# Patient Record
Sex: Male | Born: 1960 | Race: White | Hispanic: No
Health system: Northeastern US, Academic
[De-identification: ages and names within clinical notes are randomized; demographics above are authoritative.]

---

## 2018-05-18 LAB — UNMAPPED LAB RESULTS
ALT/SGPT (EXT): 34 U/L (ref 10–40)
AST/SGOT (EXT): 29 U/L (ref 10–40)

## 2018-07-09 ENCOUNTER — Ambulatory Visit

## 2018-07-12 ENCOUNTER — Ambulatory Visit: Admit: 2018-07-12

## 2018-07-12 ENCOUNTER — Ambulatory Visit: Admitting: Neurology

## 2018-07-12 NOTE — Progress Notes (Signed)
.  Progress Notes  .  Patient: Robert May, Robert May  Provider: Ronna Polio    .  DOB: Jan 24, 1961 Age: 57 Y Sex: Male  .  PCP: Unknown, Unknown    Date: 07/12/2018  .  --------------------------------------------------------------------------------  .  REASON FOR APPOINTMENT  .  1. FU 30  .  2. Headaches  .  HISTORY OF PRESENT ILLNESS  .  GENERAL:   Dear Dr. Kyung Bacca is follow-up visit for 57 year old man who  had work accident in 2004. He was cutting pipes, his instrument  became dysfunctional, cut his face on the right side, hit him on  the head. He developed facial fracture. The patient had 7 plastic  surgeries. He developed bad headaches after that, numbness on the  face. Through the years headache became more tolerable and less  frequent. He still has a time to time. He does not take any  medication for headaches now. He was followed by several  neurologist. His wife is saying that patient is sad but he denies  any depression or anxiety. He sleeps well. According to his wife  he is snoring, he is tired during the day and sometimes he falls  asleep for short time. The patient feels drowsy when he is  watching TV, as a passenger in the car, when he is lying down in  the afternoon, when he is sitting quietly after a lunch. He never  feels drowsy when he is driving himself or when he is stopped in  the traffic as a driver.  .  CURRENT MEDICATIONS  .  Taking tylenol  .  PAST MEDICAL HISTORY  .  Posttraumatic syndrom  Palpitation  Headaches  .  ALLERGIES  .  N.K.D.A.  .  SURGICAL HISTORY  .  head injury  knee surgery  .  FAMILY HISTORY  .  Mother: deceased 47 yrs  Father: deceased 78 yrs  2 son(s) - healthy.  CHF.  Marland Kitchen  SOCIAL HISTORY  .  Doesn't smoke. Takes one glass of wine with dinner. Married. Has  2 children. Retired.  Marland Kitchen  REVIEW OF SYSTEMS  .  Has stomachache sometimes. No nausea. Sometimes has heartburn. No  cough, sometimes has SOB. Sometimes has chest pain. No  palpitations. Has headaches sometimes. Has bad  dreams and  sometimes he seems to act in sleep.Has positional dizziness. No  prblems to urinate. No constipation. The rest of 10 point system  review is negative.  Marland Kitchen  VITAL SIGNS  .  Pain scale 1, Ht-in 69, Wt-lbs 200, BMI 29.53, BP 132/62, RR 12,  Pulse sitting 66.  Marland Kitchen  PHYSICAL EXAMINATION  .  GENERAL:  General Appearance:  well-developed, well-hydrated,  well-nourished, well-groomed, No dysmorphic features, no acute  distress, alert and oriented.  Mood/Affect:  sad.  HEENT  Normocephalic, atraumatic. Sclera clear, conjunctiva pink,  moist oral mucosa, neck supple.  Neck  No carotid Bruits, supple.  Skin  No rash.  Cardiovascular  heart regular rate and rhythm.  The patient is awake and alert with slightly decreased memory,  good attention and concentration, general fund of knowledge and  language. He doesn't smile, but as I said he denies depression.  No papilledema. No bruits of the head and neck. Pupils equal and  reactive to light. Extraocular movements intact. Normal sensation  the face. No facial muscle weakness. Soft palate up. Tongue in  the midline. He can shrug her shoulders. His hearing is down. He  has scars on the face. Visual  fields are full on confrontation.  He moves all 4 extremities. Deep tendon reflexes symmetrical.  Plantar response downgoing. Finger-to-nose fine. Normal gait. No  meningeal signs. No astereognosis. Neck circumference is 16.  Throat exam is fine. ESS is 10.  .  ASSESSMENTS  .  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  Obstructive sleep apnea - G47.33  .  Mood disorder - F39  .  The patient had significant accident at walk with facial fracture  and multiple plastic surgeries. He developed posttraumatic  headache improved somewhat through the years. He may have  posttraumatic mood disorder, unspecified, not real depression.  The patient doesn't want to take medications for his headache or  mood. Risk of not taking medication discussed. He does not  have  any suicidal thoughts. The patient may have slight cognitive  impairment, also secondary to injury. I will followed clinically.  I suspect the patient may have obstructive sleep apnea. He should  avoid sleeping in the supine position. He should watch his  weight. He needs to do exercises and to lead healthy lifestyle.  The patient was explained that untreated sleep disordered  breathing can predispose to accidents and cardiovascular  condition. He wants to think about sleep test, he will give me a  call about his decision. His wife was present on discussion.I  spent on counseling 45 minutes more than 50% of the time of the  appointment.Follow up appointment is scheduled.Thank you very  much for let me taking care about him.  .  FOLLOW UP  .  1 Year  .  Electronically signed by Ronna Polio , MD on  07/12/2018 at 07:29 PM EST  .  Document electronically signed by Ronna Polio    .

## 2018-07-12 NOTE — Progress Notes (Signed)
---    **Progress Notes**    ---    **Patient:** Robert May   **Provider:** Ronna Polio, MD     **DOB:** [birth year] **Age:** 23 Y **Sex:** Male   **Date:** 07/12/2018     **PCP:** Unknown Unknown          * * *        ---        **Reason for Appointment**    ---       1\. FU 30    ---    2\. Headaches    ---       **History of Present Illness**    ---     _GENERAL_ :    Dear Dr. Boykin Reaper,    this is follow-up visit for 57 year old man who had work accident in 2004. He  was cutting pipes, his instrument became dysfunctional, cut his face on the  right side, hit him on the head. He developed facial fracture. The patient had  7 plastic surgeries. He developed bad headaches after that, numbness on the  face. Through the years headache became more tolerable and less frequent. He  still has a time to time. He does not take any medication for headaches now.  He was followed by several neurologist. His wife is saying that patient is sad  but he denies any depression or anxiety. He sleeps well. According to his wife  he is snoring, he is tired during the day and sometimes he falls asleep for  short time. The patient feels drowsy when he is watching TV, as a passenger in  the car, when he is lying down in the afternoon, when he is sitting quietly  after a lunch. He never feels drowsy when he is driving himself or when he is  stopped in the traffic as a driver.       **Current Medications**    ---    Taking     * tylenol     ---      **Past Medical History**    ---       Posttraumatic syndrom.        ---    Palpitation.        ---    Headaches.        ---         --- ---       **Surgical History**    ---       head injury    ---    knee surgery    ---      **Family History**    ---       Mother: deceased 26 yrs    ---    Father: deceased 38 yrs    ---    2 son(s) - healthy.    ---    CHF.    ---       **Social History**    ---       Doesn't smoke. Takes one glass of wine with dinner. Married. Has 2  children. Retired.     ---       **Allergies**    ---       N.K.D.A.    ---       **Review of Systems**    ---    Has stomachache sometimes. No nausea. Sometimes has heartburn. No cough,  sometimes has SOB. Sometimes has chest pain. No palpitations. Has headaches  sometimes. Has bad dreams and sometimes he seems  to act in sleep.Has  positional dizziness. No prblems to urinate. No constipation. The rest of 10  point system review is negative.       **Vital Signs**    ---    Pain scale 1, Ht-in 69, Wt-lbs 200, BMI 29.53, BP 132/62, RR 12, Pulse sitting  66.       **Physical Examination**    ---     _GENERAL_ :    General Appearance: well-developed, well-hydrated, well-nourished, well-  groomed, No dysmorphic features, no acute distress, alert and oriented.    Mood/Affect: sad.    HEENT Normocephalic, atraumatic. Sclera clear, conjunctiva pink, moist oral  mucosa, neck supple.    Neck No carotid Bruits, supple.    Skin No rash.    Cardiovascular heart regular rate and rhythm.    The patient is awake and alert with slightly decreased memory, good attention  and concentration, general fund of knowledge and language. He doesn't smile,  but as I said he denies depression. No papilledema. No bruits of the head and  neck. Pupils equal and reactive to light. Extraocular movements intact. Normal  sensation the face. No facial muscle weakness. Soft palate up. Tongue in the  midline. He can shrug her shoulders. His hearing is down. He has scars on the  face. Visual fields are full on confrontation. He moves all 4 extremities.  Deep tendon reflexes symmetrical. Plantar response downgoing. Finger-to-nose  fine. Normal gait. No meningeal signs. No astereognosis. Neck circumference is  16. Throat exam is fine. ESS is 10.       **Assessments**    ---    1\. 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 Obstructive sleep apnea - G47.33    ---    3\. Mood disorder - F39    ---      The patient had significant accident  at walk with facial fracture and  multiple plastic surgeries. He developed posttraumatic headache improved  somewhat through the years. He may have posttraumatic mood disorder,  unspecified, not real depression. The patient doesn't want to take medications  for his headache or mood. Risk of not taking medication discussed. He does not  have any suicidal thoughts. The patient may have slight cognitive impairment,  also secondary to injury. I will followed clinically. I suspect the patient  may have obstructive sleep apnea. He should avoid sleeping in the supine  position. He should watch his weight. He needs to do exercises and to lead  healthy lifestyle. The patient was explained that untreated sleep disordered  breathing can predispose to accidents and cardiovascular condition. He wants  to think about sleep test, he will give me a call about his decision. His wife  was present on discussion.    I spent on counseling 45 minutes more than 50% of the time of the appointment.    Follow up appointment is scheduled.    Thank you very much for let me taking care about him.    ---       **Follow Up**    ---    1 Year    Electronically signed by Ronna Polio , MD on 07/12/2018 at 07:29 PM EST    Sign off status: Completed          * * *      Patient: Robert May   Provider: Ronna Polio, MD    --- ---    DOB: 13-May-1961  Date:  07/12/2018    Note generated by eClinicalWorks EMR/PM Software ([email or web address])    ---

## 2019-01-20 ENCOUNTER — Ambulatory Visit: Admitting: Neurology

## 2019-01-20 NOTE — Progress Notes (Signed)
* * *      [name], [name] **DOB:** October 08, 1960 (58 yo M) **Acc No.** [id number] **DOS:**  01/20/2019    ---       [name], [name]**    ------    51 Y old Male, DOB: 1961-07-15    [address], [place], [place] [address]    Home: [phone]    Provider: Ronna Polio        * * *    Telephone Encounter    ---    Answered by  Dierdre Forth Date: 01/20/2019       Time: 10:49 AM    Reason  APPT FOR 7/6    ------            Message                     Patient has WC but dates are back dated. Can you take a look please?                Action Taken                     Namubiru,Percis  01/20/2019 10:51:59 AM >      Hi Percis, this is all set. I spoke to the adjuster earlier in the week and he called registration with all the work comp information.  He has [place] ins and he is all set thank you, karen      Torti,Karen  01/20/2019 11:25:47 AM >                    * * *                ---          * * *         Provider: Ronna Polio 01/20/2019    ---    Note generated by eClinicalWorks EMR/PM Software ([email or web address])

## 2019-01-24 ENCOUNTER — Ambulatory Visit: Admit: 2019-01-24 | Payer: Worker's Comp, Other unspecified

## 2019-01-24 ENCOUNTER — Ambulatory Visit: Admitting: Neurology

## 2019-01-24 NOTE — Progress Notes (Signed)
 .  Progress Notes  .  Patient: Robert May, Robert May  Provider: Ronna Polio    .  DOB: March 27, 1961 Age: 58 Y Sex: Male  .  PCP: Unknown, Unknown    Date: 01/24/2019  .  --------------------------------------------------------------------------------  .  REASON FOR APPOINTMENT  .  1. IN OFFICE FU WC  .  HISTORY OF PRESENT ILLNESS  .  GENERAL:   Dear Dr. Kyung Bacca is follow-up visit for 58 year old man. As  you know, he had work accident in 2004. He was cutting pipes, his  instrument became dysfunctional, cut his face on the right side,  hit him on the head. He developed facial fracture. The patient  had 7 plastic surgeries. He developed bad headaches after that,  numbness on the face. Through the years headache became more  tolerable and less frequent. He still has a time to time. He does  not take any medication for headaches now. He was followed by  several neurologist. During the last several months the patient  was stable, he did not have new symptoms, his headaches did not  get any worse. On the last appointment I suspected possibility of  sleep apnea, I discussed at length with the patient and explained  him by I think so, but the patient declined sleep study and he  still does not want to do it, we talked about it today again. I  explained to him that untreated sleep disordered breathing can  predispose to accidents and cardiovascular condition.  .  CURRENT MEDICATIONS  .  Taking tylenol  .  PAST MEDICAL HISTORY  .  Posttraumatic syndrom  Palpitation  Headaches  .  ALLERGIES  .  N.K.D.A.  .  SURGICAL HISTORY  .  head injury  knee surgery  .  FAMILY HISTORY  .  Mother: deceased 46 yrs  Father: deceased 58 yrs  2 son(s) - healthy.  CHF.  Marland Kitchen  SOCIAL HISTORY  .  Doesn't smoke. Takes one glass of wine with dinner. Married. Has  2 children. Retired.  Marland Kitchen  REVIEW OF SYSTEMS  .  The patient does not have stomachache. No nausea. No heartburn.  No cough, or SOB. No chest pain. No palpitations. Has headaches  sometimes. Has bad  dreams and sometimes he seems to act in  sleep.Has positional dizziness. No problems to urinate. No  constipation. The rest of 10 point system review is negative.  Marland Kitchen  VITAL SIGNS  .  Pain scale 1, Ht-in 69, Wt-lbs 195, BMI 28.79.  Marland Kitchen  PHYSICAL EXAMINATION  .  GENERAL:  General Appearance:  well-developed, well-hydrated,  well-nourished, well-groomed, No dysmorphic features, no acute  distress, alert and oriented.  Mood/Affect:  sad.  HEENT  Normocephalic, atraumatic. Sclera clear, conjunctiva pink,  moist oral mucosa, neck supple.  Neck  No carotid Bruits, supple.  Skin  No rash.  Cardiovascular  heart regular rate and rhythm.  The patient is awake and alert with slightly decreased memory,  good attention and concentration, general fund of knowledge and  language. He doesn't smile. Denies depression. No papilledema. No  bruits of the head and neck. Pupils equal and reactive to light.  Extraocular movements intact. Normal sensation the face. No  facial muscle weakness. Soft palate up. Tongue in the midline. He  can shrug shoulders. His hearing is down. He has scars on the  face. Visual fields are full on confrontation. He moves all 4  extremities. Deep tendon reflexes symmetrical. Plantar response  downgoing. Finger-to-nose fine. Normal  gait. No meningeal signs.  No astereognosis. ESS is 10.  .  ASSESSMENTS  .  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  Obstructive sleep apnea - G47.33  .  Mood disorder - F39  .  The patient had significant accident at walk with facial fracture  and multiple plastic surgeries. He developed posttraumatic  headache improved somewhat through the years. He may have  posttraumatic mood disorder, unspecified, not real depression.  The patient doesn't want to take medications for his headache or  mood. Risk of not taking medication discussed. He does not have  any suicidal thoughts. The patient may have slight cognitive  impairment, also secondary to injury. I  will followed clinically.  I suspect the patient may have obstructive sleep apnea but he is  refusing evaluation.The patient can call me with any questions or  concerns.Follow up appointment is scheduled.Thank you very much  for let me taking care about him.  .  FOLLOW UP  .  1 Year  .  Electronically signed by Ronna Polio , MD on  01/25/2019 at 12:55 AM EDT  .  Document electronically signed by Ronna Polio    .

## 2019-01-24 NOTE — Progress Notes (Signed)
 [name], [name] **DOB:** 1961/06/12 (58 yo M) **Acc No.** [id number] **DOS:**  01/24/2019    ---        ---     **Progress Notes**    ---    **Patient:** Robert May  **Provider:** Ronna Polio, MD     **DOB:** 14-Nov-1960 **Age:** 58 Y **Sex:** Male  **Date:** 01/24/2019     **PCP:** Unknown Unknown         * * *        ---      **Reason for Appointment**    ---      1\. IN OFFICE FU WC    ---      **History of Present Illness**    ---     _GENERAL_ :    Dear Dr. Boykin Reaper,    this is follow-up visit for 58 year old man. As you know, he had work accident  in 2004. He was cutting pipes, his instrument became dysfunctional, cut his  face on the right side, hit him on the head. He developed facial fracture. The  patient had 7 plastic surgeries. He developed bad headaches after that,  numbness on the face. Through the years headache became more tolerable and  less frequent. He still has a time to time. He does not take any medication  for headaches now. He was followed by several neurologist. During the last  several months the patient was stable, he did not have new symptoms, his  headaches did not get any worse. On the last appointment I suspected  possibility of sleep apnea, I discussed at length with the patient and  explained him by I think so, but the patient declined sleep study and he still  does not want to do it, we talked about it today again. I explained to him  that untreated sleep disordered breathing can predispose to accidents and  cardiovascular condition.      **Current Medications**    ---    Taking    * tylenol     ---     **Past Medical History**    ---      Posttraumatic syndrom.        ---    Palpitation.        ---    Headaches.        ---        ------      **Surgical History**    ---      head injury    ---    knee surgery    ---     **Family History**    ---      Mother: deceased 63 yrs    ---    Father: deceased 56 yrs    ---    2 son(s) - healthy.    ---    CHF.    ---       **Social History**    ---      Doesn't smoke. Takes one glass of wine with dinner. Married. Has 2  children. Retired.    ---      **Allergies**    ---      N.K.D.A.    ---      **Review of Systems**    ---    The patient does not have stomachache. No nausea. No heartburn. No cough, or  SOB. No chest pain. No palpitations. Has headaches sometimes. Has bad dreams  and sometimes he seems to act in sleep.Has positional dizziness. No  problems  to urinate. No constipation. The rest of 10 point system review is negative.      **Vital Signs**    ---    Pain scale 1, Ht-in 69, Wt-lbs 195, BMI 28.79.      **Physical Examination**    ---     _GENERAL_ :    General Appearance: well-developed, well-hydrated, well-nourished, well-  groomed, No dysmorphic features, no acute distress, alert and oriented.    Mood/Affect: sad.    HEENT Normocephalic, atraumatic. Sclera clear, conjunctiva pink, moist oral  mucosa, neck supple.    Neck No carotid Bruits, supple.    Skin No rash.    Cardiovascular heart regular rate and rhythm.    The patient is awake and alert with slightly decreased memory, good attention  and concentration, general fund of knowledge and language. He doesn't smile.  Denies depression. No papilledema. No bruits of the head and neck. Pupils  equal and reactive to light. Extraocular movements intact. Normal sensation  the face. No facial muscle weakness. Soft palate up. Tongue in the midline. He  can shrug shoulders. His hearing is down. He has scars on the face. Visual  fields are full on confrontation. He moves all 4 extremities. Deep tendon  reflexes symmetrical. Plantar response downgoing. Finger-to-nose fine. Normal  gait. No meningeal signs. No astereognosis. ESS is 10.      **Assessments**    ---    1\. 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 Obstructive sleep apnea - G47.33    ---    3\. Mood disorder - F39    ---     The patient had significant accident at walk  with facial fracture and  multiple plastic surgeries. He developed posttraumatic headache improved  somewhat through the years. He may have posttraumatic mood disorder,  unspecified, not real depression. The patient doesn't want to take medications  for his headache or mood. Risk of not taking medication discussed. He does not  have any suicidal thoughts. The patient may have slight cognitive impairment,  also secondary to injury. I will followed clinically. I suspect the patient  may have obstructive sleep apnea but he is refusing evaluation.The patient can  call me with any questions or concerns.    Follow up appointment is scheduled.    Thank you very much for let me taking care about him.    ---      **Follow Up**    ---    1 Year    Electronically signed by Ronna Polio , MD on 01/25/2019 at 12:55 AM EDT    Sign off status: Completed          * * *      Provider: Ronna Polio, MD Date: 01/24/2019    ------    Note generated by eClinicalWorks EMR/PM Software ([email or web address])    ---

## 2019-06-25 LAB — LIPID PROFILE (EXT)
Chol/HDL Ratio (EXT): 4.3
Cholesterol (EXT): 164 mg/dL (ref <200)
HDL Cholesterol (EXT): 38 mg/dL — ABNORMAL LOW (ref 40–59)
LDL Cholesterol (EXT): 109 mg/dL — ABNORMAL HIGH (ref <100)
NON HDL Cholesterol (EXT): 126 mg/dL
Triglycerides (EXT): 86 mg/dL (ref <150)
VLDL Cholesterol (EXT): 17.2 mg/dL

## 2019-06-25 LAB — UNMAPPED LAB RESULTS: Glucose (EXT): 99 mg/dL (ref 70–99)

## 2019-06-26 LAB — PSA (EXT): PSA (EXT): 0.5 ng/mL (ref <=4.0)

## 2019-10-18 ENCOUNTER — Ambulatory Visit

## 2019-10-19 ENCOUNTER — Ambulatory Visit: Admit: 2019-10-19 | Payer: Worker's Comp, Other unspecified

## 2019-10-19 ENCOUNTER — Ambulatory Visit: Admitting: Neurology

## 2019-10-19 NOTE — Progress Notes (Signed)
 .  Progress Notes  .  Patient: Robert May, Robert May  Provider: Ronna Polio    .  DOB: [birth year] Age: 59 Y Sex: Male  .  PCP: Unknown, Unknown    Date: 10/19/2019  .  --------------------------------------------------------------------------------  .  REASON FOR APPOINTMENT  .  1. Headaches  .  HISTORY OF PRESENT ILLNESS  .  GENERAL:   Dear Dr. Kyung Bacca is follow-up visit for 59 year old man. As  you know, he had work accident in 2004. He was cutting pipes, his  instrument became dysfunctional, cut his face on the right side,  hit him on the head. He developed facial fracture. The patient  had 7 plastic surgeries. He developed bad headaches after that,  numbness on the face. Through the years headache became more  tolerable and less frequent. He still has a time to time. He does  not take any medication for headaches now. He was followed by  several neurologist. The patient is stable, no new symptoms, his  headaches are not frequent and not severe. He is tired, snoring,  may have obstructive sleep apnea, but refuses sleep study. The  patient is saying that his mood is fine and his memory is stable.  He did not have any new medical problems, medications, allergy.  .  CURRENT MEDICATIONS  .  Taking tylenol  .  PAST MEDICAL HISTORY  .  Posttraumatic syndrom  Palpitation  Headaches  .  ALLERGIES  .  N.K.D.A.  .  SURGICAL HISTORY  .  head injury  knee surgery  .  FAMILY HISTORY  .  Mother: deceased 60 yrs  Father: deceased 45 yrs  2 son(s) - healthy.  CHF.  Marland Kitchen  SOCIAL HISTORY  .  Doesn't smoke. Takes one glass of wine with dinner. Married. Has  2 children. Retired.  Marland Kitchen  REVIEW OF SYSTEMS  .  As above. The patient does not have stomachache. No nausea. No  heartburn. No cough, or SOB. No chest pain. No palpitations. Has  headaches sometimes. Has bad dreams and sometimes he seems to act  in sleep.Has no dizziness. No problems to urinate. No  constipation. The rest of 10 point system review is negative.  Marland Kitchen  VITAL SIGNS  .  Pain  scale 1, Ht-in 69, Wt-lbs 190, BMI 28.06.  .  PHYSICAL EXAMINATION  .  GENERAL:  General Appearance:  well-developed, well-hydrated,  well-nourished, well-groomed, No dysmorphic features, no acute  distress, alert and oriented.  Mood/Affect:  apathic.  HEENT  Normocephalic, atraumatic. Sclera clear, conjunctiva pink,  moist oral mucosa, neck supple.  Neck  supple.  Skin  No rash.  Cardiovascular  no carotid bruits.  The patient is awake and alert with decreased memory, good  attention and concentration, general fund of knowledge and  language. He gives approximate answers on most of my questions.  He doesn't smile. Denies depression. No papilledema. No bruits of  the head and neck. Pupils equal and reactive to light.  Extraocular movements intact. Normal sensation the face. No  facial muscle weakness. Soft palate up. Tongue in the midline. He  can shrug shoulders. His hearing is down. He has scars on the  face. Visual fields are full on confrontation. He moves all 4  extremities. Deep tendon reflexes symmetrical. Plantar response  downgoing. Finger-to-nose fine. Normal gait. No meningeal signs.  No astereognosis. ESS is 11.  .  ASSESSMENTS  .  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  Obstructive sleep  apnea - G47.33  .  Mood disorder - F39  .  The patient is 59 year old man who had significant accident at  walk with facial fracture and multiple plastic surgeries. He  developed posttraumatic headache, improved somewhat through the  years. He may have posttraumatic mood disorder, unspecified, not  real depression, may be disthymia and apathy. The patient doesn't  want to take medications for his headache or mood. Risk of not  taking medication discussed. He does not have any suicidal  thoughts. The patient may have slight cognitive impairment, also  secondary to injury. In the same time he gives me approximate  answers to most of the questions. I suspect the patient may  have  obstructive sleep apnea but he is refusing evaluation. We talk  about it again today. I would like to check his  neuropsychological test and the patient agreed, but later I  received a message that he wants to postpone it. The patient can  call me with any questions or concerns.Follow up appointment is  scheduled.Thank you for let me taking care about him.Visit lasted  30 minutes.  .  FOLLOW UP  .  6 Months  .  Electronically signed by Ronna Polio , MD on  10/19/2019 at 04:55 PM EDT  .  Document electronically signed by Ronna Polio    .

## 2019-10-19 NOTE — Progress Notes (Signed)
 Robert May, Robert May **DOB:** [birth year] (59 yo M) **Acc No.** [id number] **DOS:**  10/19/2019    ---        ---     **Progress Notes**    ---    **Patient:** Robert May  **Provider:** Ronna Polio, MD     **DOB:** [birth year] **Age:** 59 Y **Sex:** Male  **Date:** 10/19/2019     **PCP:** Unknown Unknown         * * *        ---      **Reason for Appointment**    ---      1\. Headaches    ---      **History of Present Illness**    ---     _GENERAL_ :    Dear Dr. Boykin Reaper,    this is follow-up visit for 59 year old man. As you know, he had work accident  in 2004. He was cutting pipes, his instrument became dysfunctional, cut his  face on the right side, hit him on the head. He developed facial fracture. The  patient had 7 plastic surgeries. He developed bad headaches after that,  numbness on the face. Through the years headache became more tolerable and  less frequent. He still has a time to time. He does not take any medication  for headaches now. He was followed by several neurologist. The patient is  stable, no new symptoms, his headaches are not frequent and not severe. He is  tired, snoring, may have obstructive sleep apnea, but refuses sleep study. The  patient is saying that his mood is fine and his memory is stable. He did not  have any new medical problems, medications, allergy.      **Current Medications**    ---    Taking    * tylenol     ---     **Past Medical History**    ---      Posttraumatic syndrom.        ---    Palpitation.        ---    Headaches.        ---        ------      **Surgical History**    ---      head injury    ---    knee surgery    ---     **Family History**    ---      Mother: deceased 30 yrs    ---    Father: deceased 19 yrs    ---    2 son(s) - healthy.    ---    CHF.    ---      **Social History**    ---      Doesn't smoke. Takes one glass of wine with dinner. Married. Has 2  children. Retired.    ---      **Allergies**    ---      N.K.D.A.    ---      **Review of  Systems**    ---    As above. The patient does not have stomachache. No nausea. No heartburn. No  cough, or SOB. No chest pain. No palpitations. Has headaches sometimes. Has  bad dreams and sometimes he seems to act in sleep.Has no dizziness. No  problems to urinate. No constipation. The rest of 10 point system review is  negative.      **Vital Signs**    ---    Pain scale 1, Ht-in 69,  Wt-lbs 190, BMI 28.06.      **Physical Examination**    ---     _GENERAL_ :    General Appearance: well-developed, well-hydrated, well-nourished, well-  groomed, No dysmorphic features, no acute distress, alert and oriented.    Mood/Affect: apathic.    HEENT Normocephalic, atraumatic. Sclera clear, conjunctiva pink, moist oral  mucosa, neck supple.    Neck  supple.    Skin No rash.    Cardiovascular no carotid bruits.    The patient is awake and alert with decreased memory, good attention and  concentration, general fund of knowledge and language. He gives approximate  answers on most of my questions. He doesn't smile. Denies depression. No  papilledema. No bruits of the head and neck. Pupils equal and reactive to  light. Extraocular movements intact. Normal sensation the face. No facial  muscle weakness. Soft palate up. Tongue in the midline. He can shrug  shoulders. His hearing is down. He has scars on the face. Visual fields are  full on confrontation. He moves all 4 extremities. Deep tendon reflexes  symmetrical. Plantar response downgoing. Finger-to-nose fine. Normal gait. No  meningeal signs. No astereognosis. ESS is 11.      **Assessments**    ---    1\. 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 Obstructive sleep apnea - G47.33    ---    3\. Mood disorder - F39    ---     The patient is 59 year old man who had significant accident at walk with  facial fracture and multiple plastic surgeries. He developed posttraumatic  headache, improved somewhat through the years. He may have  posttraumatic mood  disorder, unspecified, not real depression, may be disthymia and apathy. The  patient doesn't want to take medications for his headache or mood. Risk of not  taking medication discussed. He does not have any suicidal thoughts. The  patient may have slight cognitive impairment, also secondary to injury. In the  same time he gives me approximate answers to most of the questions. I suspect  the patient may have obstructive sleep apnea but he is refusing evaluation. We  talk about it again today. I would like to check his neuropsychological test  and the patient agreed, but later I received a message that he wants to  postpone it. The patient can call me with any questions or concerns.    Follow up appointment is scheduled.    Thank you for let me taking care about him.    Visit lasted 30 minutes.    ---      **Follow Up**    ---    6 Months    Electronically signed by Ronna Polio , MD on 10/19/2019 at 04:55 PM EDT    Sign off status: Completed          * * *      Provider: Ronna Polio, MD Date: 10/19/2019    ------    Note generated by eClinicalWorks EMR/PM Software ([email or web address])    ---

## 2020-04-10 ENCOUNTER — Ambulatory Visit: Admit: 2020-04-10 | Payer: Worker's Comp, Other unspecified

## 2020-04-10 ENCOUNTER — Ambulatory Visit (HOSPITAL_BASED_OUTPATIENT_CLINIC_OR_DEPARTMENT_OTHER): Admitting: Psychiatry

## 2020-04-10 ENCOUNTER — Ambulatory Visit: Admitting: Neurology with Special Qualifications in Child Neurology

## 2020-04-10 NOTE — Progress Notes (Signed)
 .  Progress Notes  .  Patient: Robert May, Robert May  Provider: Verlin Dike    .  DOB: May 29, 1961 Age: 59 Y Sex: Male  .  PCP: Unknown, Unknown    Date: 04/10/2020  .  --------------------------------------------------------------------------------  .  REASON FOR APPOINTMENT  .  1. TRANSFER FROM [facility]  .  HISTORY OF PRESENT ILLNESS  .  GENERAL:  Dear Dr. Ernestina Patches was a pleasure seeing  Mr. Quintanar today for management of his chronic posttraumatic  headache disorder. He is transferring his care to me from Dr.  Blain Pais, who recently retired. Previous to her, he saw Dr. Debby Bud  for many years in this office. Records reviewed.He began having  severe, migraine-like headaches since a work-related head trauma  in 2004. He was cutting pipes with heavy machinery when it became  dysfunctional and the cutting saw ricocheted on his face. His  sustained a deep cut on the entire right side of his face. He had  multiple reconstructive plastic surgeries. He had orbital  fracture. There was no intracranial bleeding. The accident  rendered him disability status. He was left with posttraumatic  headache disorder. The headaches are migraine-like. He describes  them as unilateral, right side with spread, throbbing, severe  lately not accompanied by nausea but accompanied by photophobia  and sonophobia. Over the years, the headaches have improved  substantially mostly in frequency. Although his last episode was  around 2 weeks ago, he can go months without it. Advil 400 mg or  Tylenol abates the headaches, along with resting. Stress and  aggravation are major triggers. He is not on any preventive  agents as his headaches are infrequent.In reviewing Dr. Donna Bernard  notes, she raised a suspicion for obstructive sleep apnea and  historically has offered sleep study but the patient declined. In  any event, although he does feel the need to nap every day after  lunch for 15-20 minutes, he denies snoring or respiratory pauses  reported by wife. He does  admit to acting out his dreams. He may  have periodic limb movements interfering with his sleep. I  brought up a sleep study today but he is not interested.He is  very active physically. He does multiple exercises either outdoor  or at the gym. His mood is described "good". He is voicing no  concerns and denies any new symptoms.  .  CURRENT MEDICATIONS  .  Taking Advil  Taking tylenol  Medication List reviewed and reconciled with the patient  .  PAST MEDICAL HISTORY  .  Posttraumatic headache  Palpitation  .  ALLERGIES  .  yes[Allergies Verified]  .  FAMILY HISTORY  .  Mother: deceased 16 yrs  Father: deceased 9 yrs  2 son(s) - healthy.  CHFNo h/o migraines.No h/o brain aneurysm.  .  SOCIAL HISTORY  .  Doesn't smoke. Takes one glass of wine with dinner. Married. Has  2 children. He is on disability since the head injury in 2004.  Marland Kitchen  REVIEW OF SYSTEMS  .  GENERAL:  .  Outpatient questionnaire reviewed.  Marland Kitchen  VITAL SIGNS  .  Ht-in 69, BP 120/80.  Marland Kitchen  PHYSICAL EXAMINATION  .  GENERAL:  General Appearance:  Looks Healthy, well developed, well  hydrated, no acute distress.  Cardiovascular  regular rate and rhythm, normal S1 S2.  Neurologic Examination: Mental Status: The patient is alert and  answers questions appropriately. Mood and behavior appropriate.  Speech is fluent. Language fluency and comprehension are intact.  Cranial  Nerves: Pupils are equal, round and reactive to light.  Disc margins are sharp. Visual fields are full by confrontation.  No ptosis. Extraocular movements are full, without nystagmus.  Facial sensation is intact to light touch. Face is symmetric to  activation. Hearing is intact conversationally. Palate elevates  symmetrically. Trapezius, platysmas, sternocleidomastoid and neck  flexors intact. Tongue protrudes midline. Motor Examination: Tone  and bulk are within normal limits. Strength is full throughout.  No orbiting. No pronator drift. Deep tendon 2+ throughout.  Plantar responses flexor. Pin  is symmetrical in all extremities.  Proprioception and vibratory sense are intact in the upper and  lower extremities. There is no evidence of appendicular ataxia.  Gait is steady and narrow-based. Able to tandem without  difficulty. Romberg is negative.  .  ASSESSMENTS  .  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 His neurologic  exam is essentially normal. Previous brain imaging ruled out an  ominous secondary cause. There is no indication for a preventive  agent. He is to continue as needed over the counters for headache  control. He may have periodic limb movements contributing to  sleep disorder, but he is not interested on a sleep study at this  point. He would like to continue to follow-up in this office and  I think that once a year is reasonable given his stable  condition. He agreed with that. He was encouraged to call me if  things get worse.  .  A total of 45 minutes was spent with the office encounter and  record review.  .  FOLLOW UP  .  1 Year  .  Electronically signed by Verlin Dike , MD on  04/11/2020 at 05:45 PM EDT  .  Document electronically signed by Verlin Dike    .

## 2020-04-10 NOTE — Progress Notes (Signed)
 * * *    Robert May, Robert May **DOB:** 09-02-1960 (59 yo M) **Acc No.** [id number] **DOS:**  04/10/2020    ---       Robert May, Robert May**    ------    59 Y old Male, DOB: 01/31/1961, External MRN: [id number]    Account Number: [id number]    [address] [place], [place]-[address]    Home: [phone]    Insurance: [employer] Payer ID: PAPER    PCP: Unknown Unknown Referring: Unknown Unknown External Visit ID: [id number]    Appointment Facility: [facility]        * * *    04/10/2020 Progress Notes: Verlin Dike, MD **CHN#:** [id number]    ------    ---       **Current Medications**    ---    Taking    * Advil     ---    * tylenol     ---    Medication List reviewed and reconciled with the patient    ---     Past Medical History    ---      Posttraumatic headache.        ---    Palpitation.        ---      **Family History**    ---      Mother: deceased 49 yrs    ---    Father: deceased 13 yrs    ---    2 son(s) - healthy.    ---    CHF    No h/o migraines.    No h/o brain aneurysm.    ---      **Social History**    ---      Doesn't smoke. Takes one glass of wine with dinner. Married. Has 2  children. He is on disability since the head injury in 2004.    ---      **Review of Systems**    ---     _GENERAL_ :    Outpatient questionnaire reviewed.          **Reason for Appointment**    ---      1\. TRANSFER FROM [facility]    ---      **History of Present Illness**    ---     _GENERAL_ :    Dear Dr. Boykin May,    It was a pleasure seeing Robert May today for management of his chronic  posttraumatic headache disorder. He is transferring his care to me from Dr.  Blain Pais, who recently retired. Previous to her, he saw Dr. Debby Bud for many  years in this office. Records reviewed.    He began having severe, migraine-like headaches since a work-related head  trauma in 2004. He was cutting pipes with heavy machinery when it became  dysfunctional and the cutting saw ricocheted on his face. His sustained a deep  cut on the  entire right side of his face. He had multiple reconstructive  plastic surgeries. He had orbital fracture. There was no intracranial  bleeding. The accident rendered him disability status. He was left with  posttraumatic headache disorder. The headaches are migraine-like. He describes  them as unilateral, right side with spread, throbbing, severe lately not  accompanied by nausea but accompanied by photophobia and sonophobia. Over the  years, the headaches have improved substantially mostly in frequency. Although  his last episode was around 2 weeks ago, he can go months without it. Advil  400 mg or Tylenol abates  the headaches, along with resting. Stress and  aggravation are major triggers. He is not on any preventive agents as his  headaches are infrequent.    In reviewing Dr. Donna Bernard notes, she raised a suspicion for obstructive sleep  apnea and historically has offered sleep study but the patient declined. In  any event, although he does feel the need to nap every day after lunch for  15-20 minutes, he denies snoring or respiratory pauses reported by wife. He  does admit to acting out his dreams. He may have periodic limb movements  interfering with his sleep. I brought up a sleep study today but he is not  interested.    He is very active physically. He does multiple exercises either outdoor or at  the gym. His mood is described "good". He is voicing no concerns and denies  any new symptoms.         **Vital Signs**    ---    Ht-in 69, BP **120/80**.      **Physical Examination**    ---     _GENERAL_ :    General Appearance: Looks Healthy, well developed, well hydrated, no acute  distress.    Cardiovascular regular rate and rhythm, normal S1 S2.    Neurologic Examination:    Mental Status: The patient is alert and answers questions appropriately. Mood  and behavior appropriate. Speech is fluent. Language fluency and comprehension  are intact. Cranial Nerves: Pupils are equal, round and reactive to  light.  Disc margins are sharp. Visual fields are full by confrontation. No ptosis.  Extraocular movements are full, without nystagmus. Facial sensation is intact  to light touch. Face is symmetric to activation. Hearing is intact  conversationally. Palate elevates symmetrically. Trapezius, platysmas,  sternocleidomastoid and neck flexors intact. Tongue protrudes midline. Motor  Examination: Tone and bulk are within normal limits. Strength is full  throughout. No orbiting. No pronator drift. Deep tendon 2+ throughout. Plantar  responses flexor. Pin is symmetrical in all extremities. Proprioception and  vibratory sense are intact in the upper and lower extremities. There is no  evidence of appendicular ataxia. Gait is steady and narrow-based. Able to  tandem without difficulty. Romberg is negative.      **Assessments**    ---    1\. 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 His neurologic exam is essentially normal. Previous  brain imaging ruled out an ominous secondary cause. There is no indication for  a preventive agent. He is to continue as needed over the counters for headache  control. He may have periodic limb movements contributing to sleep disorder,  but he is not interested on a sleep study at this point. He would like to  continue to follow-up in this office and I think that once a year is  reasonable given his stable condition. He agreed with that. He was encouraged  to call me if things get worse.    ---     A total of 45 minutes was spent with the office encounter and record review.    ---      **Follow Up**    ---    1 Year    Electronically signed by Verlin Dike , MD on 04/11/2020 at 05:45 PM EDT    Sign off status: Completed        * * *  [facility]    [address]    [place], [place] [address]    Tel: [phone]    Fax:  [phone]              * * *          Progress Note: Verlin Dike, MD 04/10/2020    ---    Note generated by eClinicalWorks EMR/PM Software ([email or web address])

## 2020-07-02 LAB — UNMAPPED LAB RESULTS
ALT/SGPT (EXT): 27 U/L (ref 10–40)
AST/SGOT (EXT): 26 U/L (ref 10–40)

## 2020-07-02 LAB — LIPID PROFILE (EXT)
Chol/HDL Ratio (EXT): 4
Cholesterol (EXT): 179 mg/dL (ref <200)
HDL Cholesterol (EXT): 45 mg/dL (ref 40–59)
LDL Cholesterol (EXT): 117 mg/dL — ABNORMAL HIGH (ref <100)
NON HDL Cholesterol (EXT): 134 mg/dL
Triglycerides (EXT): 87 mg/dL (ref <150)
VLDL Cholesterol (EXT): 17.4 mg/dL

## 2020-07-03 LAB — PSA (EXT): PSA (EXT): 0.41 ng/mL (ref <=4.00)

## 2020-10-16 NOTE — Progress Notes (Signed)
---    **Progress Notes**    ---    **Patient:** Robert May   **Provider:** Ronna Polio, MD     **DOB:** [birth year] **Age:** 60 Y **Sex:** Male   **Date:** 07/12/2018     **PCP:** Unknown Unknown          * * *        ---        **Reason for Appointment**    ---       1\. FU 30    ---    2\. Headaches    ---       **History of Present Illness**    ---     _GENERAL_ :    Dear Dr. Boykin Reaper,    this is follow-up visit for 60 year old man who had work accident in 2004. He  was cutting pipes, his instrument became dysfunctional, cut his face on the  right side, hit him on the head. He developed facial fracture. The patient had  7 plastic surgeries. He developed bad headaches after that, numbness on the  face. Through the years headache became more tolerable and less frequent. He  still has a time to time. He does not take any medication for headaches now.  He was followed by several neurologist. His wife is saying that patient is sad  but he denies any depression or anxiety. He sleeps well. According to his wife  he is snoring, he is tired during the day and sometimes he falls asleep for  short time. The patient feels drowsy when he is watching TV, as a passenger in  the car, when he is lying down in the afternoon, when he is sitting quietly  after a lunch. He never feels drowsy when he is driving himself or when he is  stopped in the traffic as a driver.       **Current Medications**    ---    Taking     * tylenol     ---      **Past Medical History**    ---       Posttraumatic syndrom.        ---    Palpitation.        ---    Headaches.        ---         --- ---       **Surgical History**    ---       head injury    ---    knee surgery    ---      **Family History**    ---       Mother: deceased 26 yrs    ---    Father: deceased 38 yrs    ---    2 son(s) - healthy.    ---    CHF.    ---       **Social History**    ---       Doesn't smoke. Takes one glass of wine with dinner. Married. Has 2  children. Retired.     ---       **Allergies**    ---       N.K.D.A.    ---       **Review of Systems**    ---    Has stomachache sometimes. No nausea. Sometimes has heartburn. No cough,  sometimes has SOB. Sometimes has chest pain. No palpitations. Has headaches  sometimes. Has bad dreams and sometimes he seems  to act in sleep.Has  positional dizziness. No prblems to urinate. No constipation. The rest of 10  point system review is negative.       **Vital Signs**    ---    Pain scale 1, Ht-in 69, Wt-lbs 200, BMI 29.53, BP 132/62, RR 12, Pulse sitting  66.       **Physical Examination**    ---     _GENERAL_ :    General Appearance: well-developed, well-hydrated, well-nourished, well-  groomed, No dysmorphic features, no acute distress, alert and oriented.    Mood/Affect: sad.    HEENT Normocephalic, atraumatic. Sclera clear, conjunctiva pink, moist oral  mucosa, neck supple.    Neck No carotid Bruits, supple.    Skin No rash.    Cardiovascular heart regular rate and rhythm.    The patient is awake and alert with slightly decreased memory, good attention  and concentration, general fund of knowledge and language. He doesn't smile,  but as I said he denies depression. No papilledema. No bruits of the head and  neck. Pupils equal and reactive to light. Extraocular movements intact. Normal  sensation the face. No facial muscle weakness. Soft palate up. Tongue in the  midline. He can shrug her shoulders. His hearing is down. He has scars on the  face. Visual fields are full on confrontation. He moves all 4 extremities.  Deep tendon reflexes symmetrical. Plantar response downgoing. Finger-to-nose  fine. Normal gait. No meningeal signs. No astereognosis. Neck circumference is  16. Throat exam is fine. ESS is 10.       **Assessments**    ---    1\. 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 Obstructive sleep apnea - G47.33    ---    3\. Mood disorder - F39    ---      The patient had significant accident  at walk with facial fracture and  multiple plastic surgeries. He developed posttraumatic headache improved  somewhat through the years. He may have posttraumatic mood disorder,  unspecified, not real depression. The patient doesn't want to take medications  for his headache or mood. Risk of not taking medication discussed. He does not  have any suicidal thoughts. The patient may have slight cognitive impairment,  also secondary to injury. I will followed clinically. I suspect the patient  may have obstructive sleep apnea. He should avoid sleeping in the supine  position. He should watch his weight. He needs to do exercises and to lead  healthy lifestyle. The patient was explained that untreated sleep disordered  breathing can predispose to accidents and cardiovascular condition. He wants  to think about sleep test, he will give me a call about his decision. His wife  was present on discussion.    I spent on counseling 45 minutes more than 50% of the time of the appointment.    Follow up appointment is scheduled.    Thank you very much for let me taking care about him.    ---       **Follow Up**    ---    1 Year    Electronically signed by Ronna Polio , MD on 07/12/2018 at 07:29 PM EST    Sign off status: Completed          * * *      Patient: Robert May   Provider: Ronna Polio, MD    --- ---    DOB: 13-May-1961  Date:  07/12/2018    Note generated by [employer] EMR/PM Software ([email or web address])    ---

## 2021-07-08 LAB — PSA (EXT): PSA (EXT): 0.43 ng/mL (ref <=4.00)

## 2021-08-06 LAB — LIPID PROFILE (EXT)
Chol/HDL Ratio (EXT): 4.8
Cholesterol (EXT): 145 mg/dL (ref <200)
HDL Cholesterol (EXT): 30 mg/dL — ABNORMAL LOW (ref 40–59)
LDL Cholesterol (EXT): 70 mg/dL (ref <100)
NON HDL Cholesterol (EXT): 115 mg/dL
Triglycerides (EXT): 227 mg/dL — ABNORMAL HIGH (ref <150)
VLDL Cholesterol (EXT): 45.4 mg/dL

## 2021-08-06 LAB — BMP (EXT)
Anion Gap (EXT): 6 (ref 5–15)
BUN (EXT): 18 mg/dL (ref 7–23)
CO2 (EXT): 27 mmol/L (ref 24–32)
CalciumCalcium (EXT): 9 mg/dL (ref 8.7–10.7)
Chloride (EXT): 103 mmol/L (ref 97–110)
Creatinine (EXT): 0.97 mg/dL (ref 0.60–1.30)
Glucose (EXT): 99 mg/dL (ref 70–99)
Potassium (EXT): 4.2 mmol/L (ref 3.5–5.3)
Sodium (EXT): 136 mmol/L (ref 135–145)
eGFR - Creat CKD-EPI (EXT): 88 mL/min/{1.73_m2} — ABNORMAL LOW (ref >=90)

## 2021-08-06 LAB — UNMAPPED LAB RESULTS
ALT/SGPT (EXT): 25 U/L (ref 10–40)
AST/SGOT (EXT): 28 U/L (ref 10–40)

## 2022-04-03 ENCOUNTER — Ambulatory Visit
Admit: 2022-04-03 | Discharge: 2022-04-03 | Payer: MEDICARE | Attending: Neurology with Special Qualifications in Child Neurology

## 2022-04-03 DIAGNOSIS — G44329 Chronic post-traumatic headache, not intractable: Secondary | ICD-10-CM

## 2022-04-03 NOTE — Progress Notes (Signed)
[name]    04/03/2022    Dear Dr. Zane Herald, MD,    I am seeing Robert May in follow-up today.  As you know, he transition his care to me from my colleague Robert May in September 2021 as my colleague retired.  She had been treated in this office for his chronic posttraumatic headache disorder.  Previous to seeing Robert May, he followed with Robert May for many years.  I noted his history of work-related head trauma in 2004 followed by developing severe, migraine-like headaches.  The trauma was in his head and face and he had multiple reconstructive plastic surgeries in his face.  He had an orbital fracture.  There was no intracranial bleed. Brain imaging was reportedly unremarkable.  The accident rendered him disability status. He was left with posttraumatic headache disorder. The headaches are migraine-like.  Over the years, the headaches have improved substantially in frequency and intensity.  I noted that he was not on any preventive treatment and he was using Tylenol or Advil as abortive agents.  I also noted that he was offered a sleep study in the past as my colleague suspected of OSA, however he declined.  He may have periodic limb movements.  He acts out his dreams.  I also offered a sleep study but he declined.  I did not make any changes to his headache abortive strategy treatment.    Interval history:    He is doing great.  He drove here.  He is active around the house.  He now only gets headaches when he is stressed out.  It feels like throbbing bifrontal with the very highest severity of 7/10 but usually it is a 5 or 6/10.  It is not accompanied by nausea or vomiting.  It is not accompanied by photophobia or sonophobia.  He can go on several weeks without a headache.  The headache respond to either Tylenol or Advil.  The headaches do not awaken him at night.  Not worse with  postural changes.  He denies any visual disturbance.  He denies neurologic symptoms.  He has been sleeping quite well, around 7 to 8 hours.  He is no longer acting out his dreams.  He feels refreshed in the morning except when he wakes up multiple times to void.  He has a history of BPH.  He is voicing no concerns.    REVIEW OF SYSTEMS: A 10 point review of systems was performed.  Positive responses were obtained for as above.    The following portions of the patient's history were reviewed and updated as appropriate: allergies, current medications, past family history, past medical history, past social history, past surgical history and problem list.    Patient Active Problem List   Diagnosis   . Benign prostatic hyperplasia with urinary obstruction   . Chronic post-traumatic headache, not intractable   . Urinary hesitancy due to benign prostatic hyperplasia     PHYSICAL EXAM:    Visit Vitals  BP 120/80     General appearance - pleasant, cooperative, no apparent distress.  HEENT: Neck is supple, with full range of motion.  There are no carotid bruits.  Cardiovascular: Regular rate and rhythm. S1 and S2.    Neurologic:       Mental Status: The patient is alert and answers questions appropriately. Mood and behavior appropriate. Speech is fluent. Language fluency and comprehension are intact. Cranial Nerves: Pupils are equal, round and reactive to light. Disc margins are  sharp. Visual fields are full by confrontation. No ptosis. Extraocular movements are full, without nystagmus. Facial sensation is intact to light touch. Face is symmetric to activation. Hearing is intact conversationally. Palate elevates symmetrically. Trapezius, platysmas, sternocleidomastoid and neck flexors intact. Tongue protrudes midline. Motor Examination: Tone and bulk are within normal limits. Strength is full throughout. No orbiting. No pronator drift. Deep tendon 2+ throughout. Plantar responses flexor. Pin is symmetrical in all extremities.  Proprioception and  vibratory sense are intact in the upper and lower extremities. There is no evidence of appendicular ataxia. Gait is steady and narrow-based. Able to tandem without difficulty. Romberg is negative.       IMPRESSION AND PLAN: Robert May is a 61 y.o. male with chronic posttraumatic headache disorder which has improved dramatically without any preventive treatment or specific abortive agents.  He is doing great and denies any neurologic symptoms.  At this point, I do not have a high suspicion that he has a sleep disorder.  The consensus is to see me on an as-needed basis moving forward.  He agreed with the plan.    Thank you for allowing me to participate in his care.  Please feel free to contact me with any further questions.    I spent 30 minutes today preparing for this visit, reviewing patient's records/diagnostic tests, conducting face-to-face encounter with the patient, documenting clinical information in the electronic medical record and coordinating care.     This note was dictated using Dragon voice recognition software, please excuse any typographical errors that may occur.

## 2024-03-11 IMAGING — MR OMBRO DIREITO
7 of 8 series · 36 of 40 positions shown · non-contrast
Comparison: none

[Series 1: localizador · axial · 7.0mm · 0.94mm/px · z∈[-18,+120]mm · 6 of 15 slices shown]
[im 1/15]
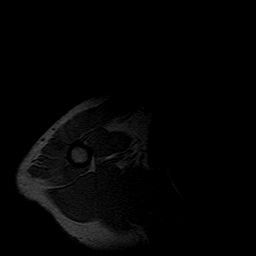
[im 3/15]
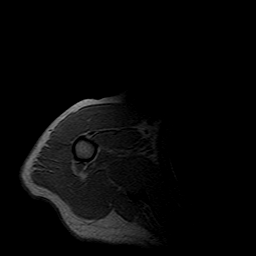
[im 6/15]
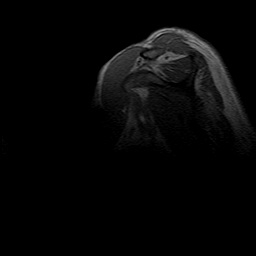
[im 9/15]
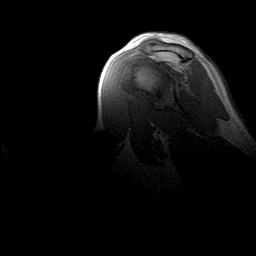
[im 12/15]
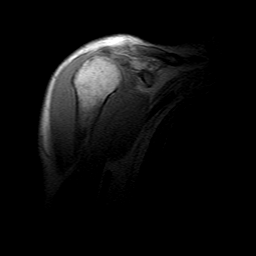
[im 15/15]
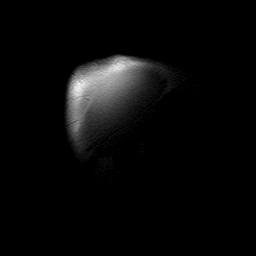

[Series 2: T2 · axial · 5.0mm · 0.94mm/px · z∈[+6,+90]mm · 5 of 14 slices shown]
[im 1/14]
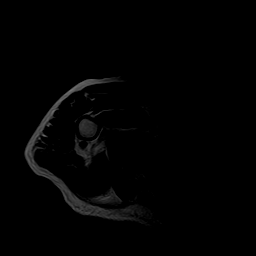
[im 4/14]
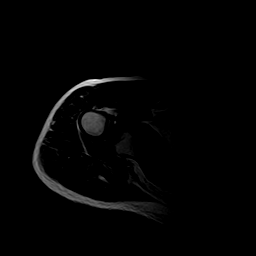
[im 7/14]
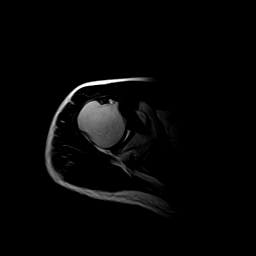
[im 10/14]
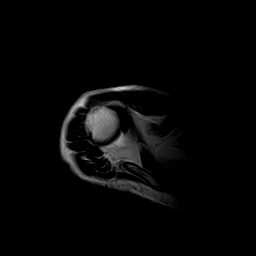
[im 14/14]
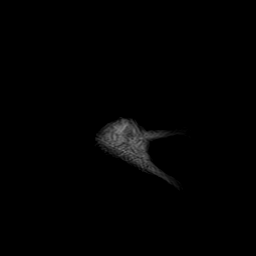

[Series 3: STIR · coronal · 5.5mm · 1.02mm/px · 4 of 14 slices shown (1 of 3)]
[im 1/14]
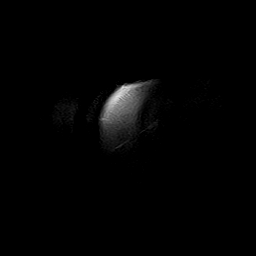
[im 5/14]
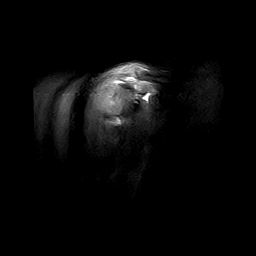
[im 9/14]
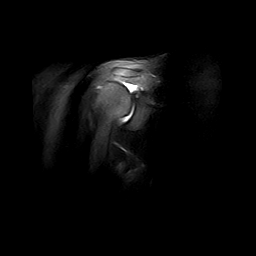
[im 14/14]
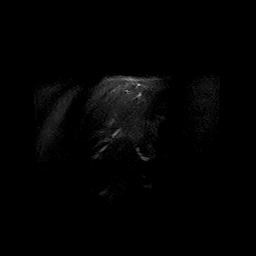

[Series 4: STIR · sagittal · 6.0mm · 0.70mm/px · 5 of 17 slices shown (2 of 3)]
[im 1/17]
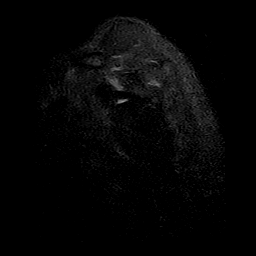
[im 5/17]
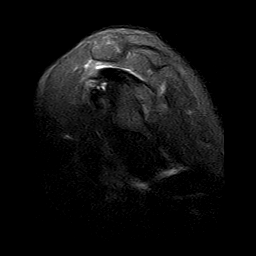
[im 9/17]
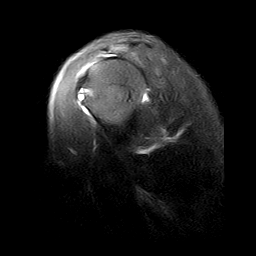
[im 13/17]
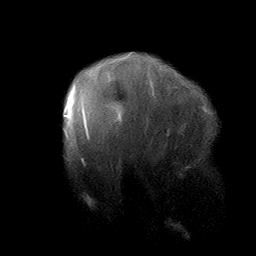
[im 17/17]
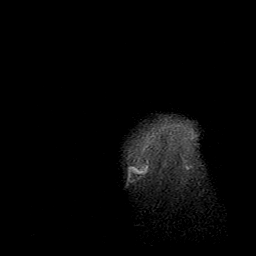

[Series 5: (person_name)06/coronal dp · oblique · 5.5mm · 0.47mm/px · 5 of 17 slices shown]
[im 1/17]
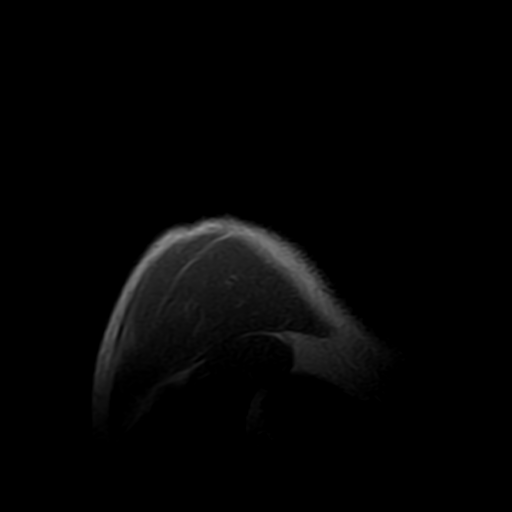
[im 5/17]
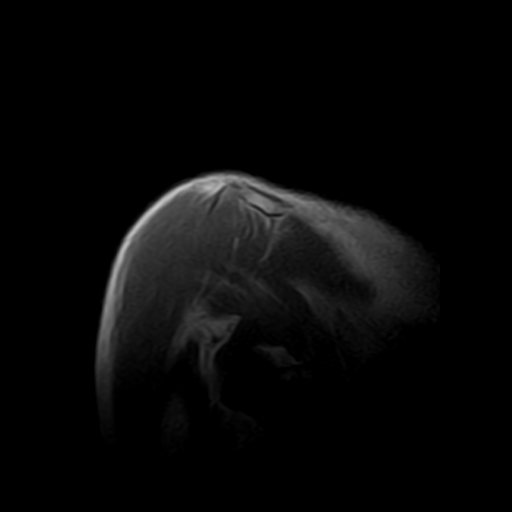
[im 9/17]
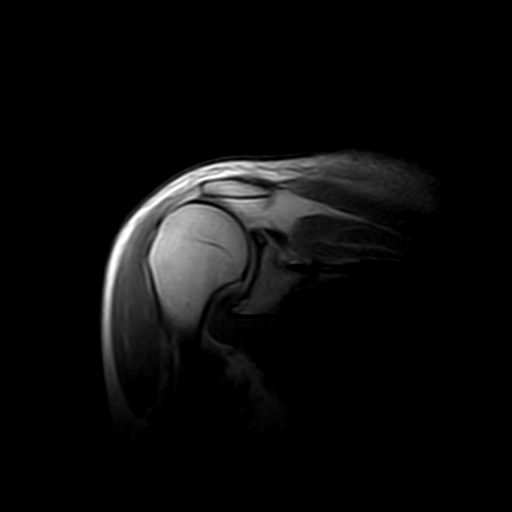
[im 13/17]
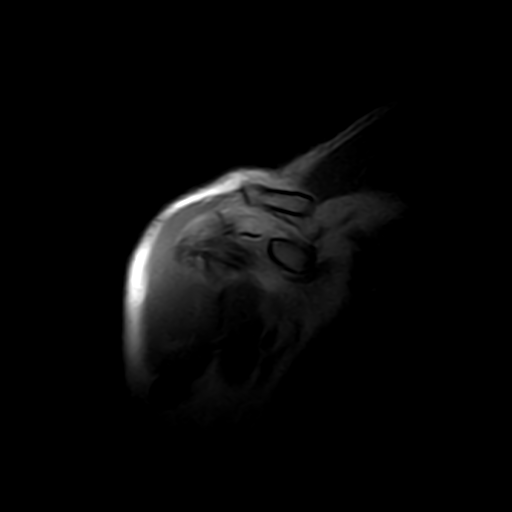
[im 17/17]
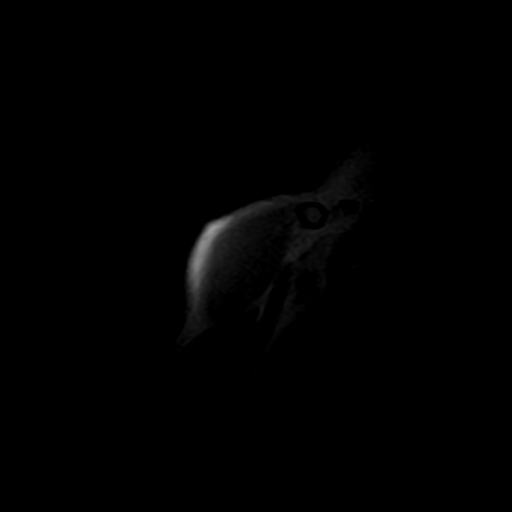

[Series 6: (person_name)06/sag dp · oblique · 5.5mm · 0.43mm/px · 6 of 19 slices shown]
[im 1/19]
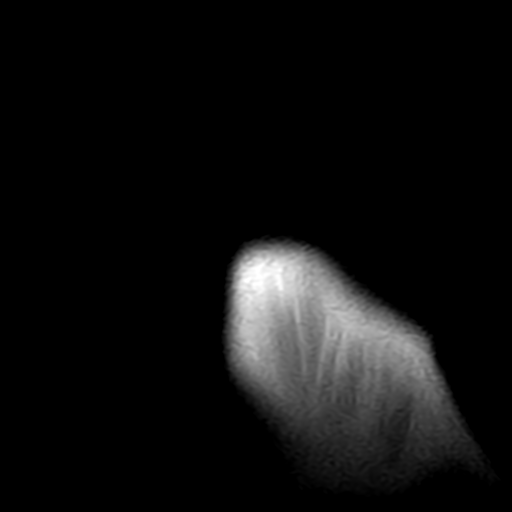
[im 4/19]
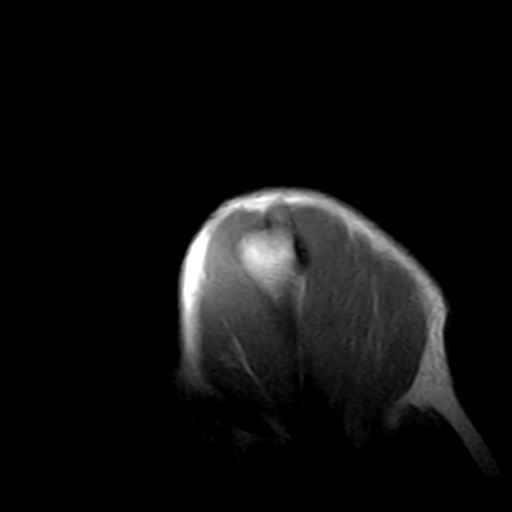
[im 8/19]
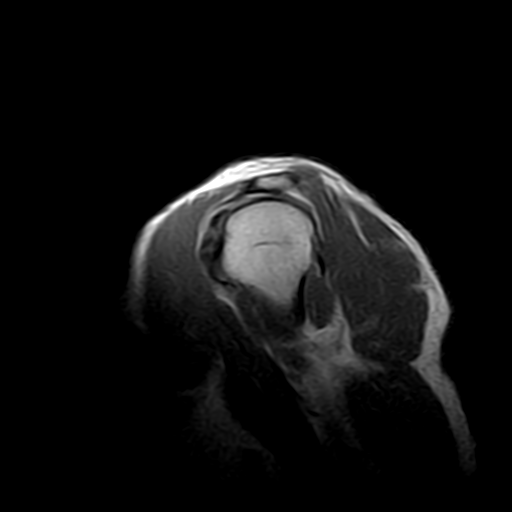
[im 11/19]
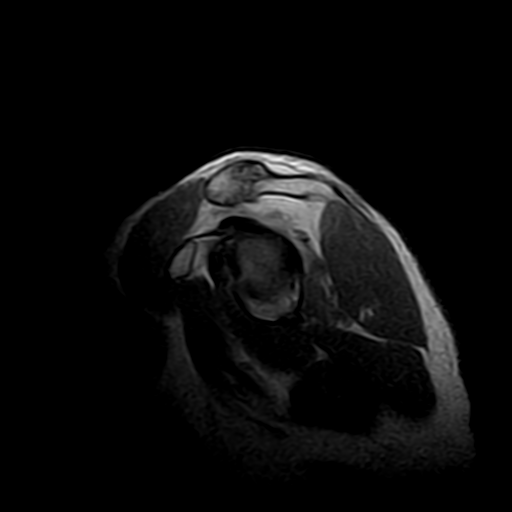
[im 15/19]
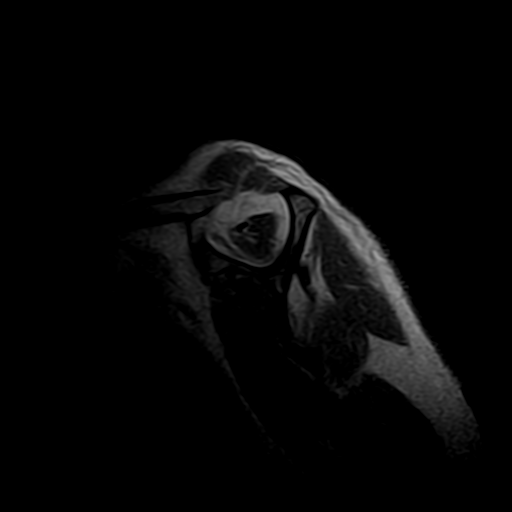
[im 19/19]
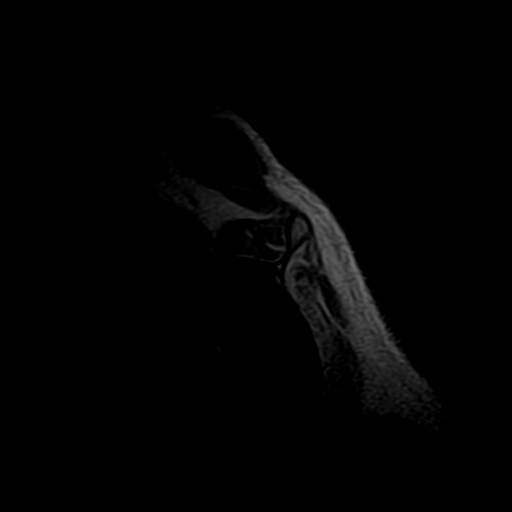

[Series 7: STIR · axial · 4.5mm · 0.94mm/px · z∈[+14,+104]mm · 5 of 16 slices shown (3 of 3)]
[im 1/16]
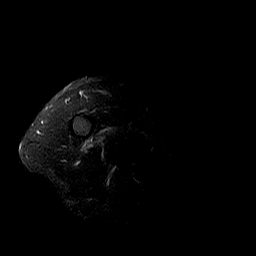
[im 4/16]
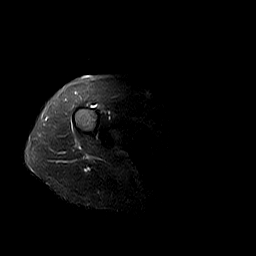
[im 8/16]
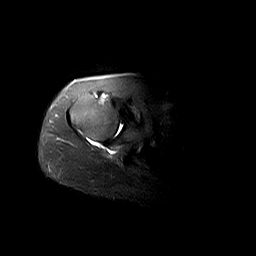
[im 12/16]
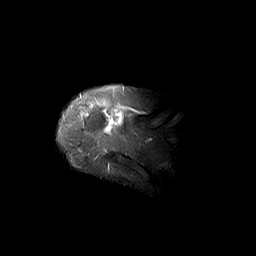
[im 16/16]
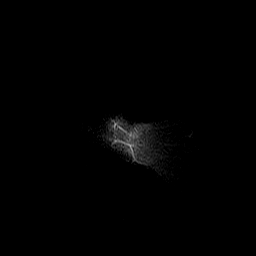

[36 of 40 positions shown; findings below may reference images not displayed]

Método:
Ressonância magnética realizada com sequências FSE em T1 e T2. Planos de cortes múltiplos.
Análise:
Rotura transﬁxante extensa do supraespinal e infraespinal, medindo cerca de 5,4 cm e com retração proximal doFURSTNER FRANCISKAcoto
tendíneo, que se encontra degenerado e delaminado, em cerca de 4,2 cm.
Associa-se atroﬁa lipossubstituição destes ventres musculares em mais de 50% do volume muscular.
RESSONÂNCIA MAGNÉTICA DO OMBRO DIREITO
Tendinopatia difusa do subescapular, com ﬁssuras intrassubstanciais justainsercionais difusas, sem transﬁxações.
Tendão do redondo menor íntegro.
Tendinopatia difusa do cabo longo do bíceps braquial, notadamente nas porções intra-articular e deﬂexional.
Demais ventres musculares tróﬁcos.
Alterações degenerativas da interface glenoumeral com osteóﬁtos marginais, aﬁlamento irregular do revestimento
condral, moderado derrame articular e extravasamento de líquido sinovial para o plano da bursa
subacromial/subdeltoidea através da rotura do manguito rotador.
Migração cranial da cabeça umeral relacionada à insuﬁciência do manguito rotador com neoarticulação e
remodelamento ósseo junto à margem inferior acromial.
Alterações degenerativas difusas do lábio glenoidal, notadamente dos recessos segmentos superiores, sem foram os
destacamentos.
Feixes neurovasculares com trajeto e sinal preservados.
Alterações degenerativas da articulação acromioclavicular com osteóﬁtos marginais, espessamento capsuloligamentar,
remodelamento ósseo, edema subcondral e edema pericapsular.
Demais estruturas ósseas sem outras particularidades.
IMPRESSÃO DIAGNÓSTICA:
Tendinopatia do manguito rotador, com rotura transﬁxante do supraespinal e do infraespinal.
Tendinopatia do cabo longo do bíceps braquial, sem transﬁxações.
Alterações degenerativas da interface glenoumeral, com moderado derrame articular e extravasamento de líquido
sinovial para o plano da bursa subacromial/subdeltoidea através da rotura do manguito rotador.
Migração cranial da cabeça umeral relacionada à insuﬁciência do manguito rotador com sinais de impacto junto ao
acrômio.
Alterações degenerativas da articulação acromioclavicular
Demais achados descritos no corpo do laudo.
